# Patient Record
Sex: Female | Born: 1995 | Hispanic: Yes | Marital: Single | Smoking: Never smoker
Health system: Southern US, Community
[De-identification: ages and names within clinical notes are randomized; demographics above are authoritative.]

## PROBLEM LIST (undated history)

## (undated) DIAGNOSIS — N390 Urinary tract infection, site not specified: Secondary | ICD-10-CM

---
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
## 2013-08-07 ENCOUNTER — Emergency Department: Payer: Self-pay | Admitting: Emergency Medicine

## 2013-08-08 LAB — BETA STREP CULTURE(ARMC)

## 2013-12-27 ENCOUNTER — Observation Stay: Payer: Self-pay

## 2014-03-31 ENCOUNTER — Inpatient Hospital Stay: Payer: Self-pay | Admitting: Obstetrics and Gynecology

## 2014-03-31 LAB — CBC WITH DIFFERENTIAL/PLATELET
BASOS ABS: 0.1 10*3/uL (ref 0.0–0.1)
BASOS PCT: 0.4 %
Eosinophil #: 0 10*3/uL (ref 0.0–0.7)
Eosinophil %: 0.2 %
HCT: 38.5 % (ref 35.0–47.0)
HGB: 12.3 g/dL (ref 12.0–16.0)
LYMPHS PCT: 14.8 %
Lymphocyte #: 1.9 10*3/uL (ref 1.0–3.6)
MCH: 29.1 pg (ref 26.0–34.0)
MCHC: 32.1 g/dL (ref 32.0–36.0)
MCV: 91 fL (ref 80–100)
Monocyte #: 1.6 x10 3/mm — ABNORMAL HIGH (ref 0.2–0.9)
Monocyte %: 12.5 %
Neutrophil #: 9.5 10*3/uL — ABNORMAL HIGH (ref 1.4–6.5)
Neutrophil %: 72.1 %
Platelet: 177 10*3/uL (ref 150–440)
RBC: 4.24 10*6/uL (ref 3.80–5.20)
RDW: 17.4 % — ABNORMAL HIGH (ref 11.5–14.5)
WBC: 13.2 10*3/uL — ABNORMAL HIGH (ref 3.6–11.0)

## 2014-03-31 LAB — PROTEIN / CREATININE RATIO, URINE
Creatinine, Urine: 86.6 mg/dL (ref 30.0–125.0)
Protein, Random Urine: 23 mg/dL — ABNORMAL HIGH (ref 0–12)
Protein/Creat. Ratio: 266 mg/gCREAT — ABNORMAL HIGH (ref 0–200)

## 2014-04-02 LAB — CBC WITH DIFFERENTIAL/PLATELET
BASOS PCT: 0.1 %
Basophil #: 0 10*3/uL (ref 0.0–0.1)
Eosinophil #: 0 10*3/uL (ref 0.0–0.7)
Eosinophil %: 0.1 %
HCT: 34 % — ABNORMAL LOW (ref 35.0–47.0)
HGB: 10.7 g/dL — AB (ref 12.0–16.0)
LYMPHS ABS: 0.6 10*3/uL — AB (ref 1.0–3.6)
Lymphocyte %: 3.4 %
MCH: 28.8 pg (ref 26.0–34.0)
MCHC: 31.5 g/dL — ABNORMAL LOW (ref 32.0–36.0)
MCV: 92 fL (ref 80–100)
MONOS PCT: 7.3 %
Monocyte #: 1.3 x10 3/mm — ABNORMAL HIGH (ref 0.2–0.9)
NEUTROS ABS: 16.3 10*3/uL — AB (ref 1.4–6.5)
Neutrophil %: 89.1 %
Platelet: 137 10*3/uL — ABNORMAL LOW (ref 150–440)
RBC: 3.72 10*6/uL — AB (ref 3.80–5.20)
RDW: 17.3 % — AB (ref 11.5–14.5)
WBC: 18.3 10*3/uL — ABNORMAL HIGH (ref 3.6–11.0)

## 2014-04-02 LAB — HEMATOCRIT: HCT: 36 % (ref 35.0–47.0)

## 2014-04-03 LAB — CBC WITH DIFFERENTIAL/PLATELET
Basophil #: 0 10*3/uL (ref 0.0–0.1)
Basophil %: 0.2 %
EOS ABS: 0.1 10*3/uL (ref 0.0–0.7)
EOS PCT: 0.5 %
HCT: 31.4 % — AB (ref 35.0–47.0)
HGB: 9.8 g/dL — AB (ref 12.0–16.0)
Lymphocyte #: 1.1 10*3/uL (ref 1.0–3.6)
Lymphocyte %: 6.5 %
MCH: 28.9 pg (ref 26.0–34.0)
MCHC: 31.3 g/dL — AB (ref 32.0–36.0)
MCV: 92 fL (ref 80–100)
MONOS PCT: 8.7 %
Monocyte #: 1.5 x10 3/mm — ABNORMAL HIGH (ref 0.2–0.9)
NEUTROS PCT: 84.1 %
Neutrophil #: 14.2 10*3/uL — ABNORMAL HIGH (ref 1.4–6.5)
PLATELETS: 128 10*3/uL — AB (ref 150–440)
RBC: 3.4 10*6/uL — ABNORMAL LOW (ref 3.80–5.20)
RDW: 17.7 % — ABNORMAL HIGH (ref 11.5–14.5)
WBC: 16.8 10*3/uL — ABNORMAL HIGH (ref 3.6–11.0)

## 2014-04-04 LAB — CBC WITH DIFFERENTIAL/PLATELET
Basophil #: 0 10*3/uL (ref 0.0–0.1)
Basophil %: 0.2 %
EOS ABS: 0 10*3/uL (ref 0.0–0.7)
EOS PCT: 0.3 %
HCT: 30.4 % — ABNORMAL LOW (ref 35.0–47.0)
HGB: 10.2 g/dL — AB (ref 12.0–16.0)
LYMPHS PCT: 10 %
Lymphocyte #: 1.2 10*3/uL (ref 1.0–3.6)
MCH: 30 pg (ref 26.0–34.0)
MCHC: 33.5 g/dL (ref 32.0–36.0)
MCV: 89 fL (ref 80–100)
Monocyte #: 1.1 x10 3/mm — ABNORMAL HIGH (ref 0.2–0.9)
Monocyte %: 9 %
NEUTROS ABS: 9.5 10*3/uL — AB (ref 1.4–6.5)
NEUTROS PCT: 80.5 %
Platelet: 165 10*3/uL (ref 150–440)
RBC: 3.4 10*6/uL — ABNORMAL LOW (ref 3.80–5.20)
RDW: 17.7 % — AB (ref 11.5–14.5)
WBC: 11.8 10*3/uL — ABNORMAL HIGH (ref 3.6–11.0)

## 2014-04-04 LAB — URINALYSIS, COMPLETE
BILIRUBIN, UR: NEGATIVE
Bilirubin,UR: NEGATIVE
Blood: NEGATIVE
GLUCOSE, UR: NEGATIVE mg/dL (ref 0–75)
Glucose,UR: NEGATIVE mg/dL (ref 0–75)
KETONE: NEGATIVE
Ketone: NEGATIVE
NITRITE: NEGATIVE
Nitrite: NEGATIVE
PH: 7 (ref 4.5–8.0)
PH: 8 (ref 4.5–8.0)
PROTEIN: NEGATIVE
RBC,UR: 103 /HPF (ref 0–5)
SPECIFIC GRAVITY: 1.01 (ref 1.003–1.030)
Specific Gravity: 1.006 (ref 1.003–1.030)
Squamous Epithelial: 3
Squamous Epithelial: <1
WBC UR: 44 /HPF (ref 0–5)

## 2014-04-04 LAB — CREATININE, SERUM
Creatinine: 0.83 mg/dL (ref 0.60–1.30)
EGFR (African American): >60

## 2014-04-04 LAB — PATHOLOGY REPORT

## 2014-04-04 LAB — GENTAMICIN LEVEL, TROUGH: Gentamicin, Trough: <0.2 ug/mL — ABNORMAL LOW (ref 0.0–2.0)

## 2014-04-05 LAB — CREATININE, SERUM: CREATININE: 0.75 mg/dL (ref 0.60–1.30)

## 2014-04-05 LAB — CBC WITH DIFFERENTIAL/PLATELET
BANDS NEUTROPHIL: 2 %
Eosinophil: 1 %
HCT: 31.2 % — AB (ref 35.0–47.0)
HGB: 10.2 g/dL — ABNORMAL LOW (ref 12.0–16.0)
Lymphocytes: 13 %
MCH: 29.3 pg (ref 26.0–34.0)
MCHC: 32.8 g/dL (ref 32.0–36.0)
MCV: 89 fL (ref 80–100)
Monocytes: 6 %
Platelet: 202 10*3/uL (ref 150–440)
RBC: 3.49 10*6/uL — AB (ref 3.80–5.20)
RDW: 17.5 % — AB (ref 11.5–14.5)
Segmented Neutrophils: 78 %
WBC: 8.9 10*3/uL (ref 3.6–11.0)

## 2014-04-05 LAB — URINE CULTURE

## 2014-04-06 LAB — GC/CHLAMYDIA PROBE AMP

## 2014-04-06 LAB — URINE CULTURE

## 2014-04-06 LAB — VANCOMYCIN, TROUGH: Vancomycin, Trough: 7 ug/mL — ABNORMAL LOW (ref 10–20)

## 2014-04-07 LAB — CBC WITH DIFFERENTIAL/PLATELET
HCT: 33.9 % — ABNORMAL LOW (ref 35.0–47.0)
HGB: 11.2 g/dL — ABNORMAL LOW (ref 12.0–16.0)
Lymphocytes: 20 %
MCH: 29.2 pg (ref 26.0–34.0)
MCHC: 33 g/dL (ref 32.0–36.0)
MCV: 89 fL (ref 80–100)
METAMYELOCYTE: 2 %
MYELOCYTE: 1 %
Monocytes: 16 %
Platelet: 265 10*3/uL (ref 150–440)
RBC: 3.82 10*6/uL (ref 3.80–5.20)
RDW: 17.7 % — ABNORMAL HIGH (ref 11.5–14.5)
Segmented Neutrophils: 61 %
WBC: 10.8 10*3/uL (ref 3.6–11.0)

## 2014-04-07 LAB — CULTURE, BLOOD (SINGLE)

## 2014-04-09 LAB — CULTURE, BLOOD (SINGLE)

## 2014-08-05 ENCOUNTER — Emergency Department: Payer: Self-pay | Admitting: Student

## 2014-09-13 ENCOUNTER — Emergency Department: Payer: Self-pay | Admitting: Emergency Medicine

## 2014-09-13 LAB — COMPREHENSIVE METABOLIC PANEL
ALBUMIN: 4.9 g/dL
ALK PHOS: 95 U/L
AST: 24 U/L
Anion Gap: 8 (ref 7–16)
BUN: 14 mg/dL
Bilirubin,Total: 0.9 mg/dL
Calcium, Total: 9.6 mg/dL
Chloride: 106 mmol/L
Co2: 24 mmol/L
Creatinine: 0.58 mg/dL
EGFR (African American): >60
EGFR (Non-African Amer.): >60
GLUCOSE: 122 mg/dL — AB
Potassium: 3.6 mmol/L
SGPT (ALT): 18 U/L
Sodium: 138 mmol/L
TOTAL PROTEIN: 8.2 g/dL — AB

## 2014-09-13 LAB — CBC WITH DIFFERENTIAL/PLATELET
Basophil #: 0 10*3/uL (ref 0.0–0.1)
Basophil %: 0.3 %
EOS ABS: 0.1 10*3/uL (ref 0.0–0.7)
EOS PCT: 0.6 %
HCT: 39 % (ref 35.0–47.0)
HGB: 12.9 g/dL (ref 12.0–16.0)
LYMPHS ABS: 1.2 10*3/uL (ref 1.0–3.6)
Lymphocyte %: 10.4 %
MCH: 28 pg (ref 26.0–34.0)
MCHC: 33.1 g/dL (ref 32.0–36.0)
MCV: 85 fL (ref 80–100)
MONO ABS: 0.6 x10 3/mm (ref 0.2–0.9)
Monocyte %: 5.5 %
Neutrophil #: 9.5 10*3/uL — ABNORMAL HIGH (ref 1.4–6.5)
Neutrophil %: 83.2 %
PLATELETS: 205 10*3/uL (ref 150–440)
RBC: 4.62 10*6/uL (ref 3.80–5.20)
RDW: 13.3 % (ref 11.5–14.5)
WBC: 11.5 10*3/uL — ABNORMAL HIGH (ref 3.6–11.0)

## 2014-09-13 LAB — URINALYSIS, COMPLETE
Bilirubin,UR: NEGATIVE
Blood: NEGATIVE
GLUCOSE, UR: NEGATIVE mg/dL (ref 0–75)
Leukocyte Esterase: NEGATIVE
Nitrite: NEGATIVE
PH: 5 (ref 4.5–8.0)
RBC,UR: <1 /HPF (ref 0–5)
Specific Gravity: 1.03 (ref 1.003–1.030)
Squamous Epithelial: 10

## 2014-09-13 LAB — LIPASE, BLOOD: LIPASE: 19 U/L — AB

## 2014-10-11 NOTE — Op Note (Signed)
PATIENT NAME:  Doris Perez, Doris Perez MR#:  [id number] DATE OF BIRTH:  1995-09-28  DATE OF PROCEDURE:  04/01/2014  PREOPERATIVE DIAGNOSIS: Active phase arrest.   POSTOPERATIVE DIAGNOSIS: Active phase arrest.   PROCEDURES: Primary low transverse cesarean section.   ANESTHESIA: Spinal.   SURGEON: Suzy Bouchard, MD  FIRST ASSISTANT: Cydney Ok, CNM  INDICATION: This is a 19 year old, gravida 1, para 0. The patient has an EDC of 04/10/2014. The patient was admitted in active labor. On 03/31/2014, the patient progressed to anterior lip and did not progress past that despite an adequate contraction pattern. The patient therefore was diagnosed with active phase arrest. The patient was counseled regarding the risk and benefits of the procedure, and consent was signed.   PROCEDURE IN DETAIL: After adequate spinal anesthesia, the patient was placed in the dorsal supine position with a hip roll under the right side. The patient's abdomen was prepped and draped in normal sterile fashion. A Pfannenstiel incision was made 2 fingerbreadths above the symphysis pubis. Sharp dissection was used to dissect to the fascia. The fascia was opened in the midline and opened in a transverse fashion. The superior aspect of the fascia was grasped with Kocher clamps and the recti muscles were dissected free. The inferior aspect of the fascia was grasped with Kocher clamps and the pyramidalis muscle was dissected free. Entry into the peritoneal cavity was performed with sharp dissection. Edematous peritoneum was noted. A lot of serous fluid was noted in the peritoneal cavity as well. The vesicouterine peritoneal fold was identified, and a bladder flap was created. The bladder was reflected inferiorly.   A low transverse uterine incision was made. Upon entry into the endometrial cavity, a creamy white discharge, non- malodorous, was noted. The incision was extended with blunt transverse traction and an extremely wedged  fetal head was then delivered through the uterine incision, and a large infant girl was delivered without difficulty through the incision. The cord was doubly clamped and passed to the nursery staff, who assigned Apgar scores of 8 and 9 at one and five minutes. Estimated fetal weight 3940 g. Female infant.   The placenta was manually delivered, and given the patient had a temperature to 103.1 prior to delivery, the placenta will be sent to pathology for pathologic review. The uterus was exteriorized and the endometrial cavity was wiped clean with a laparotomy tape. The uterine incision was then closed with #1 chromic suture in a running locking fashion with good approximation of the edges. Three additional figure-of-eight sutures were required for good hemostasis. Fallopian tubes appeared normal. Ovaries appeared normal. The posterior cul-de-sac was then irrigated and suctioned, and the uterus was placed back into the abdominal cavity and the paracolic gutters were wiped clean with a laparotomy tape. Uterine incision again appeared hemostatic and Interceed was placed over the uterine incision in a T-shaped fashion.   The fascia was then closed with 0 Vicryl suture in a running nonlocking fashion with good approximation of the edges, and good hemostasis was noted. Subcutaneous tissues were irrigated and bovied, and the skin was reapproximated with staples. There were no complications. Estimated blood loss was 500 mL. Intraoperative fluids 800 mL. The patient did receive 2 g of IV Ancef prior to commencement of the case, and the patient demonstrated a small amount of straw-colored urine during the procedure.   The patient was taken to the recovery room in good condition.    ____________________________ Suzy Bouchard, MD tjs:MT D: 04/01/2014 14:55:11 ET  T: 04/01/2014 16:14:17 ET JOB#: [id number]  cc: Suzy Bouchardhomas J. Schermerhorn, MD, <Dictator> Suzy BouchardHOMAS J SCHERMERHORN MD ELECTRONICALLY SIGNED 04/02/2014  9:09

## 2014-10-28 NOTE — H&P (Signed)
L&D Evaluation:  History:  HPI 19 y/o G1 @ 38/4wks EDC 04/10/14 arrives with c/o bloody show and regular contractions. Baby active, denies leaking fluid. Denies s sx pre/e no HA, visual disturbances, RUQ or epigastric pain. Vomited x 1 after arrival (milkshake on the way to hospital) Care @ [facility], GBS negative.   Presents with contractions   Patient's Medical History No Chronic Illness   Patient's Surgical History none   Medications Pre Natal Vitamins   Allergies PCN, rash   Social History none   Family History Non-Contributory   ROS:  ROS All systems were reviewed.  HEENT, CNS, GI, GU, Respiratory, CV, Renal and Musculoskeletal systems were found to be normal.   Exam:  Vital Signs stable   Urine Protein not completed, to lab   General no apparent distress   Mental Status clear   Chest clear   Heart normal sinus rhythm   Abdomen gravid, non-tender   Estimated Fetal Weight Average for gestational age   Fetal Position vtx   Fundal Height term   Back no CVAT   Edema 4+  Pitting  pedal pre tibial   Reflexes 2+   Clonus negative   Pelvic no external lesions, 4cm 90% vtx @ -2 BBOW nl show   Mebranes Intact   FHT normal rate with no decels, baseline 140's 150's avg variability with accels   Fetal Heart Rate 144   Ucx regular, Q 3/404mins 60 sec moderate   Skin dry   Lymph no lymphadenopathy   Impression:  Impression early labor, evaluation for PIH   Plan:  Plan EFM/NST, monitor contractions and for cervical change, monitor BP, PIH panel   Comments Admitted, breathing thru uc's well-requests epidural. VSS B/P nl AF PIH and pro/creat labs. Pts Mom at bedside, supportive.   Electronic Signatures: Albertina ParrLugiano, Isabel Freese B (CNM)  (Signed 12-Oct-15 18:13)  Authored: L&D Evaluation   Last Updated: 12-Oct-15 18:13 by Albertina ParrLugiano, Dorna Mallet B (CNM)

## 2015-12-29 ENCOUNTER — Encounter: Payer: Self-pay | Admitting: Emergency Medicine

## 2015-12-29 ENCOUNTER — Emergency Department: Payer: BLUE CROSS/BLUE SHIELD

## 2015-12-29 ENCOUNTER — Emergency Department
Admission: EM | Admit: 2015-12-29 | Discharge: 2015-12-29 | Disposition: A | Discharge status: Home / Self Care | Payer: BLUE CROSS/BLUE SHIELD | Attending: Emergency Medicine | Admitting: Emergency Medicine

## 2015-12-29 DIAGNOSIS — Y9389 Activity, other specified: Secondary | ICD-10-CM | POA: Insufficient documentation

## 2015-12-29 DIAGNOSIS — W228XXA Striking against or struck by other objects, initial encounter: Secondary | ICD-10-CM | POA: Insufficient documentation

## 2015-12-29 DIAGNOSIS — S39012A Strain of muscle, fascia and tendon of lower back, initial encounter: Secondary | ICD-10-CM | POA: Insufficient documentation

## 2015-12-29 DIAGNOSIS — Y999 Unspecified external cause status: Secondary | ICD-10-CM | POA: Diagnosis not present

## 2015-12-29 DIAGNOSIS — M545 Low back pain: Secondary | ICD-10-CM | POA: Diagnosis present

## 2015-12-29 DIAGNOSIS — Y929 Unspecified place or not applicable: Secondary | ICD-10-CM | POA: Diagnosis not present

## 2015-12-29 DIAGNOSIS — R51 Headache: Secondary | ICD-10-CM | POA: Insufficient documentation

## 2015-12-29 DIAGNOSIS — R519 Headache, unspecified: Secondary | ICD-10-CM

## 2015-12-29 MED ORDER — METHOCARBAMOL 750 MG PO TABS: 750.0000 mg | ORAL_TABLET | Freq: Four times a day (QID) | ORAL | Status: DC

## 2015-12-29 MED ORDER — BUTALBITAL-APAP-CAFFEINE 50-325-40 MG PO TABS: 1.0000 | ORAL_TABLET | Freq: Four times a day (QID) | ORAL | Status: DC | PRN

## 2015-12-29 NOTE — Discharge Instructions (Signed)
Take medication as directed.
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
## 2015-12-29 NOTE — ED Notes (Signed)
Pt to ed with c/o fall on Friday and hit her head.  Pt reports now headache to back of head.
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
## 2016-02-22 ENCOUNTER — Emergency Department: Payer: BLUE CROSS/BLUE SHIELD

## 2016-02-22 ENCOUNTER — Emergency Department
Admission: EM | Admit: 2016-02-22 | Discharge: 2016-02-22 | Disposition: A | Discharge status: Home / Self Care | Payer: BLUE CROSS/BLUE SHIELD | Attending: Emergency Medicine | Admitting: Emergency Medicine

## 2016-02-22 ENCOUNTER — Encounter: Payer: Self-pay | Admitting: Emergency Medicine

## 2016-02-22 DIAGNOSIS — Y999 Unspecified external cause status: Secondary | ICD-10-CM | POA: Insufficient documentation

## 2016-02-22 DIAGNOSIS — S161XXA Strain of muscle, fascia and tendon at neck level, initial encounter: Secondary | ICD-10-CM | POA: Insufficient documentation

## 2016-02-22 DIAGNOSIS — Y92410 Unspecified street and highway as the place of occurrence of the external cause: Secondary | ICD-10-CM | POA: Insufficient documentation

## 2016-02-22 DIAGNOSIS — Y9389 Activity, other specified: Secondary | ICD-10-CM | POA: Diagnosis not present

## 2016-02-22 DIAGNOSIS — M542 Cervicalgia: Secondary | ICD-10-CM | POA: Diagnosis present

## 2016-02-22 MED ORDER — NAPROXEN 500 MG PO TABS: 500.0000 mg | ORAL_TABLET | Freq: Two times a day (BID) | ORAL | 0 refills | Status: AC

## 2016-02-22 MED ORDER — METHOCARBAMOL 750 MG PO TABS: 750.0000 mg | ORAL_TABLET | Freq: Four times a day (QID) | ORAL | 0 refills | Status: AC
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
## 2017-01-09 ENCOUNTER — Emergency Department
Admission: EM | Admit: 2017-01-09 | Discharge: 2017-01-09 | Disposition: A | Discharge status: Home / Self Care | Payer: Managed Care, Other (non HMO) | Attending: Emergency Medicine | Admitting: Emergency Medicine

## 2017-01-09 DIAGNOSIS — Z791 Long term (current) use of non-steroidal anti-inflammatories (NSAID): Secondary | ICD-10-CM | POA: Insufficient documentation

## 2017-01-09 DIAGNOSIS — Z79899 Other long term (current) drug therapy: Secondary | ICD-10-CM | POA: Insufficient documentation

## 2017-01-09 DIAGNOSIS — L723 Sebaceous cyst: Secondary | ICD-10-CM | POA: Insufficient documentation

## 2017-01-09 DIAGNOSIS — L02212 Cutaneous abscess of back [any part, except buttock]: Secondary | ICD-10-CM | POA: Insufficient documentation

## 2017-01-09 DIAGNOSIS — L089 Local infection of the skin and subcutaneous tissue, unspecified: Secondary | ICD-10-CM | POA: Insufficient documentation

## 2017-01-09 MED ORDER — SULFAMETHOXAZOLE-TRIMETHOPRIM 800-160 MG PO TABS: 1.0000 | ORAL_TABLET | Freq: Two times a day (BID) | ORAL | 0 refills | Status: AC

## 2017-01-09 MED ORDER — LIDOCAINE HCL (PF) 1 % IJ SOLN
5.0000 mL | Freq: Once | INTRAMUSCULAR | Status: AC
  Administered 2017-01-09: 5 mL
  Filled 2017-01-09: qty 5

## 2017-01-09 MED ORDER — SULFAMETHOXAZOLE-TRIMETHOPRIM 800-160 MG PO TABS
1.0000 | ORAL_TABLET | Freq: Once | ORAL | Status: AC
  Administered 2017-01-09: 1 via ORAL
  Filled 2017-01-09: qty 1

## 2017-01-09 NOTE — ED Provider Notes (Signed)
[facility] Emergency Department Provider Note ____________________________________________  Time seen: 1808  I have reviewed the triage vital signs and the nursing notes.  HISTORY  Chief Complaint  Abscess  HPI Doris Perez is a 21 y.o. female presents to the ED for evaluation of an infected cyst on the side of her back. Patient describes she's been known to have a very small, round, stable cyst to the right flank. She notes that over the last 2 weeks area has become increasingly tender, swollen, and dark in color. She is apply warm compresses over the last several days but denies any spontaneous drainage. She denies any history of abscess, boil, or MRSA infection. She presents today for management of an infected sebaceous cyst.  Past Medical History:  Diagnosis Date  . UTI (urinary tract infection)     Patient Active Problem List   Diagnosis Date Noted  . UTI (urinary tract infection)     History reviewed. No pertinent surgical history.  Prior to Admission medications   Medication Sig Start Date End Date Taking? Authorizing Provider  methocarbamol (ROBAXIN) 750 MG tablet Take 1 tablet (750 mg total) by mouth 4 (four) times daily. 02/22/16   Beers, Charmayne Sheerharles M, PA-C  naproxen (NAPROSYN) 500 MG tablet Take 1 tablet (500 mg total) by mouth 2 (two) times daily with a meal. 02/22/16   Beers, Charmayne Sheerharles M, PA-C  sulfamethoxazole-trimethoprim (BACTRIM DS,SEPTRA DS) 800-160 MG tablet Take 1 tablet by mouth 2 (two) times daily. 01/09/17   Keylani Perlstein, Charlesetta IvoryJenise V Bacon, PA-C    Allergies Penicillins  No family history on file.  Social History Social History  Substance Use Topics  . Smoking status: Never Smoker  . Smokeless tobacco: Never Used  . Alcohol use Yes    Review of Systems  Constitutional: Negative for fever. Cardiovascular: Negative for chest pain. Respiratory: Negative for shortness of breath. Musculoskeletal: Negative for back pain. Skin: Negative for  rash. Right trunk infected cyst as above  Neurological: Negative for headaches, focal weakness or numbness. ____________________________________________  PHYSICAL EXAM:  VITAL SIGNS: ED Triage Vitals [01/09/17 1734]  Enc Vitals Group     BP (!) 145/66     Pulse Rate 82     Resp 16     Temp 99 F (37.2 C)     Temp Source Oral     SpO2 100 %     Weight 130 lb (59 kg)     Height      Head Circumference      Peak Flow      Pain Score 6     Pain Loc      Pain Edu?      Excl. in GC?     Constitutional: Alert and oriented. Well appearing and in no distress. Head: Normocephalic and atraumatic. Cardiovascular: Normal rate, regular rhythm. Normal distal pulses. Respiratory: Normal respiratory effort.  Gastrointestinal: Soft and nontender. No distention. Musculoskeletal: Nontender with normal range of motion in all extremities.  Neurologic:  Normal gait without ataxia. Normal speech and language. No gross focal neurologic deficits are appreciated. Skin:  Skin is warm, dry and intact. No rash noted. Patient with a firm, well demarcated area of hyperpigmentation over the right posterior trunk. There is palpable subcutaneous cystic formation noted. There is a small pustule overlying the area but no spontaneous drainage is appreciated. ____________________________________________  PROCEDURES  Bactrim DS 1 PO  INCISION AND DRAINAGE Performed by: Lissa HoardMenshew, Jacy Howat V Bacon Consent: Verbal consent obtained. Risks and benefits:  risks, benefits and alternatives were discussed Type: abscess  Body area: right trunk/back  Anesthesia: local infiltration  Incision was made with a scalpel.  Local anesthetic: lidocaine 1% w/o epinephrine  Anesthetic total: 5 ml  Complexity: complex Blunt dissection to break up loculations  Drainage: purulent  Drainage amount: moderate  Packing material: 1/4 in iodoform gauze  Patient tolerance: Patient tolerated the procedure well with no immediate  complications. ____________________________________________  INITIAL IMPRESSION / ASSESSMENT AND PLAN / ED COURSE  Patient s/p I&D of an infected sebaceous cyst of the trunk. The wound is appropriately dressed and patient is given return instructions. A prescription for Bactrim is provided. ____________________________________________  FINAL CLINICAL IMPRESSION(S) / ED DIAGNOSES  Final diagnoses:  Infected sebaceous cyst of skin  Abscess of back      Lissa Hoard, PA-C 01/09/17 1920    Don Perking, [place], MD 01/09/17 2028

## 2017-01-09 NOTE — ED Notes (Signed)
Pt states she noticed a bump on R hip about a month ago and recently it has grown in size. Unsure of drainage. Denies fever. States it is swollen. Alert, oriented, ambulatory.

## 2017-01-09 NOTE — ED Triage Notes (Signed)
Abscess appearing round bump to right side of back.

## 2017-01-09 NOTE — Discharge Instructions (Signed)
Keep the area clean, dry, and covered. Take the antibiotic as directed. Apply warm compresses over the dressing to promote healing. Follow-up with [facility] or return to the ED in 3 days for wound check.

## 2017-12-13 ENCOUNTER — Other Ambulatory Visit: Payer: Self-pay

## 2017-12-13 ENCOUNTER — Encounter: Payer: Self-pay | Admitting: Emergency Medicine

## 2017-12-13 ENCOUNTER — Emergency Department
Admission: EM | Admit: 2017-12-13 | Discharge: 2017-12-13 | Disposition: A | Discharge status: Home / Self Care | Payer: Commercial Managed Care - PPO | Attending: Emergency Medicine | Admitting: Emergency Medicine

## 2017-12-13 DIAGNOSIS — Z79899 Other long term (current) drug therapy: Secondary | ICD-10-CM | POA: Insufficient documentation

## 2017-12-13 DIAGNOSIS — R0981 Nasal congestion: Secondary | ICD-10-CM

## 2017-12-13 DIAGNOSIS — J020 Streptococcal pharyngitis: Secondary | ICD-10-CM | POA: Diagnosis not present

## 2017-12-13 LAB — GROUP A STREP BY PCR: Group A Strep by PCR: DETECTED — AB

## 2017-12-13 MED ORDER — AZITHROMYCIN 250 MG PO TABS: ORAL_TABLET | ORAL | 0 refills | Status: AC

## 2017-12-13 MED ORDER — FEXOFENADINE-PSEUDOEPHED ER 60-120 MG PO TB12: 1.0000 | ORAL_TABLET | Freq: Two times a day (BID) | ORAL | 0 refills | Status: AC

## 2017-12-13 MED ORDER — MAGIC MOUTHWASH W/LIDOCAINE: 5.0000 mL | Freq: Four times a day (QID) | ORAL | 0 refills | Status: AC

## 2017-12-13 NOTE — ED Provider Notes (Signed)
[facility] Emergency Department Provider Note   ____________________________________________   First MD Initiated Contact with Patient 12/13/17 [phone]     (approximate)  I have reviewed the triage vital signs and the nursing notes.   HISTORY  Chief Complaint Otalgia; Nausea; Sore Throat; and Nasal Congestion    HPI Doris Perez is a 22 y.o. female patient presents with 4 days of sinus congestion, bilateral ear pressure, sore throat, intermittent nausea.  Patient states [facility] ago and given Bromfed-DM of only mild transient relief.  Patient rates the pain as 8/10.  Patient described the pain is "aching".  No other palliative measures for complaint.  Past Medical History:  Diagnosis Date  . UTI (urinary tract infection)     Patient Active Problem List   Diagnosis Date Noted  . UTI (urinary tract infection)     History reviewed. No pertinent surgical history.  Prior to Admission medications   Medication Sig Start Date End Date Taking? Authorizing Provider  azithromycin (ZITHROMAX Z-PAK) 250 MG tablet Take 2 tablets (500 mg) on  Day 1,  followed by 1 tablet (250 mg) once daily on Days 2 through 5. 12/13/17 12/18/17  Joni Reining, PA-C  fexofenadine-pseudoephedrine (ALLEGRA-D) 60-120 MG 12 hr tablet Take 1 tablet by mouth 2 (two) times daily. 12/13/17   Joni Reining, PA-C  magic mouthwash w/lidocaine SOLN Take 5 mLs by mouth 4 (four) times daily. 12/13/17   Joni Reining, PA-C  methocarbamol (ROBAXIN) 750 MG tablet Take 1 tablet (750 mg total) by mouth 4 (four) times daily. 02/22/16   Beers, Charmayne Sheer, PA-C  naproxen (NAPROSYN) 500 MG tablet Take 1 tablet (500 mg total) by mouth 2 (two) times daily with a meal. 02/22/16   Beers, Charmayne Sheer, PA-C  sulfamethoxazole-trimethoprim (BACTRIM DS,SEPTRA DS) 800-160 MG tablet Take 1 tablet by mouth 2 (two) times daily. 01/09/17   Menshew, Charlesetta Ivory, PA-C    Allergies Penicillins  No  family history on file.  Social History Social History   Tobacco Use  . Smoking status: Never Smoker  . Smokeless tobacco: Never Used  Substance Use Topics  . Alcohol use: Yes  . Drug use: No    Review of Systems Constitutional: No fever/chills Eyes: No visual changes. ENT: Nasal congestion, ear pressure and sore throat.   Cardiovascular: Denies chest pain. Respiratory: Denies shortness of breath. Gastrointestinal: No abdominal pain.  Nausea without vomiting. no diarrhea.  No constipation. Genitourinary: Negative for dysuria. Musculoskeletal: Negative for back pain. Skin: Negative for rash. Neurological: Positive for frontal headaches, denies focal weakness or numbness. Allergic/Immunilogical: Penicillin  ____________________________________________   PHYSICAL EXAM:  VITAL SIGNS: ED Triage Vitals  Enc Vitals Group     BP 12/13/17 0902 115/73     Pulse Rate 12/13/17 0902 92     Resp 12/13/17 0902 20     Temp 12/13/17 0902 98.6 F (37 C)     Temp Source 12/13/17 0902 Oral     SpO2 12/13/17 0902 99 %     Weight 12/13/17 0856 138 lb (62.6 kg)     Height 12/13/17 0856 5' (1.524 m)     Head Circumference --      Peak Flow --      Pain Score 12/13/17 0856 8     Pain Loc --      Pain Edu? --      Excl. in GC? --    Constitutional: Alert  and oriented. Well appearing and in no acute distress. Nose: Edematous nasal turbinate thick rhinorrhea.  Bilateral maxillary guarding. Mouth/Throat: Mucous membranes are moist.  Oropharynx erythematous. Neck: No stridor. Hematological/Lymphatic/Immunilogical: No cervical lymphadenopathy. Cardiovascular: Normal rate, regular rhythm. Grossly normal heart sounds.  Good peripheral circulation. Respiratory: Normal respiratory effort.  No retractions. Lungs CTAB. Gastrointestinal: Soft and nontender. No distention. No abdominal bruits. No CVA tenderness. Neurologic:  Normal speech and language. No gross focal neurologic deficits are  appreciated. No gait instability. Skin:  Skin is warm, dry and intact. No rash noted. Psychiatric: Mood and affect are normal. Speech and behavior are normal.  ____________________________________________   LABS (all labs ordered are listed, but only abnormal results are displayed)  Labs Reviewed  GROUP A STREP BY PCR - Abnormal; Notable for the following components:      Result Value   Group A Strep by PCR DETECTED (*)    All other components within normal limits   ____________________________________________  EKG   ____________________________________________  RADIOLOGY  ED MD interpretation:    Official radiology report(s): No results found.  ____________________________________________   PROCEDURES  Procedure(s) performed: None  Procedures  Critical Care performed: No  ____________________________________________   INITIAL IMPRESSION / ASSESSMENT AND PLAN / ED COURSE  As part of my medical decision making, I reviewed the following data within the electronic MEDICAL RECORD NUMBER    Patient complain of bilateral ear pain sore throat and intermittent nausea.  Discussed rapid strep results with patient.  Patient given discharge care instruction.  Patient given prescription for the Z-Pak secondary to her penicillin allergies.  Patient also given prescription for Allegra-D and Magic mouthwash.  Patient advised to follow-up with PCP for continued care.      ____________________________________________   FINAL CLINICAL IMPRESSION(S) / ED DIAGNOSES  Final diagnoses:  Strep pharyngitis  Nasal congestion     ED Discharge Orders        Ordered    azithromycin (ZITHROMAX Z-PAK) 250 MG tablet     12/13/17 1053    fexofenadine-pseudoephedrine (ALLEGRA-D) 60-120 MG 12 hr tablet  2 times daily     12/13/17 1053    magic mouthwash w/lidocaine SOLN  4 times daily     12/13/17 1053       Note:  This document was prepared using Dragon voice recognition software  and may include unintentional dictation errors.    Joni ReiningSmith, Ronald K, PA-C 12/13/17 1056    Emily FilbertWilliams, Jonathan E, MD 12/13/17 [phone]

## 2017-12-13 NOTE — Discharge Instructions (Signed)
Take medication as directed.  Advised over-the-counter Tylenol ibuprofen for headache/pain.

## 2017-12-13 NOTE — ED Notes (Signed)
See triage note presents with bilateral ear pain and sore throat  Was seen and dx'd with URI on Monday  States she feels worse   Afebrile on arrival

## 2017-12-13 NOTE — ED Triage Notes (Signed)
Patient complaining of bilateral ear pain, feeling of pressure behind her eyes, nausea that comes and goes, sore throat.  Seen Monday at [facility] and diagnosed with URI, placed on medicine that starts with a "B".  Here today because she feels worse.  Alert and oriented, color good. NAD.

## 2018-01-06 IMAGING — CT CT HEAD W/O CM
3 series · 16 of 46 positions shown, 19 images · non-contrast
Comparison: None.

CLINICAL DATA: Persistent occipital headache following fall 2 days
prior. Recent loss of consciousness

EXAM:
CT HEAD WITHOUT CONTRAST
TECHNIQUE: Contiguous axial images were obtained from the base of the skull
through the vertex without intravenous contrast.

[Series 2: head wo · axial · 0.41mm/px · z∈[-126,-6]mm · 10 of 29 slices shown, 13 images]
[im 3/29  brain]
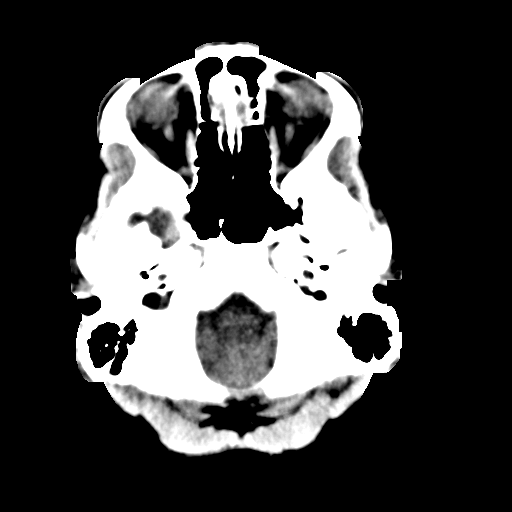
[im 3/29  bone]
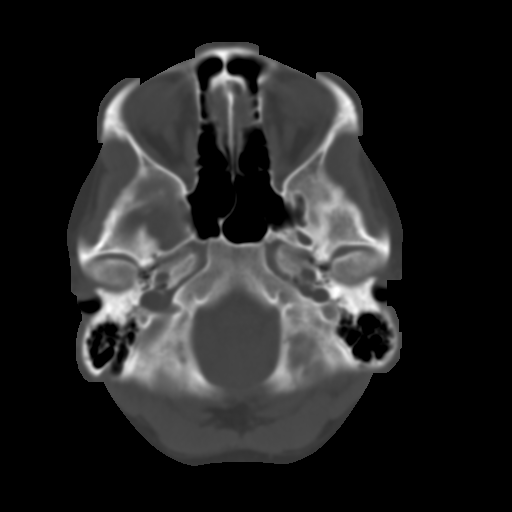
[im 6/29  brain]
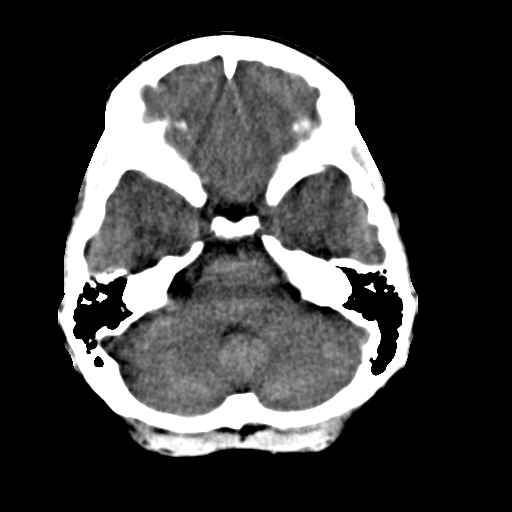
[im 8/29  brain]
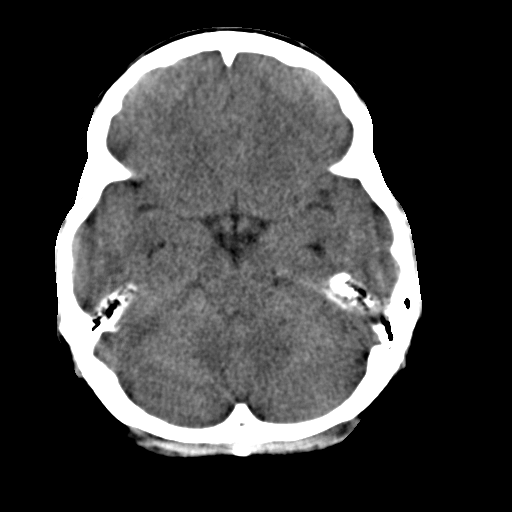
[im 11/29  brain]
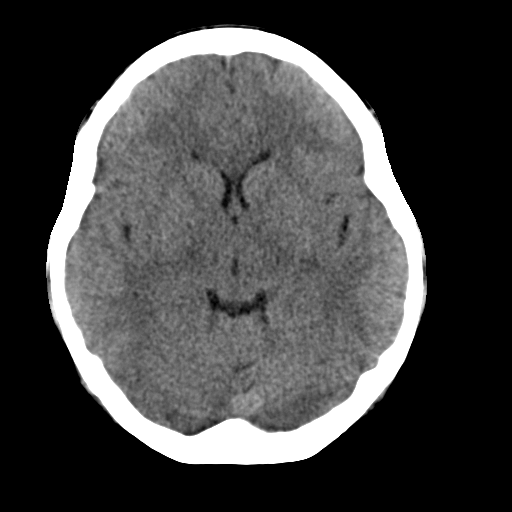
[im 14/29  brain]
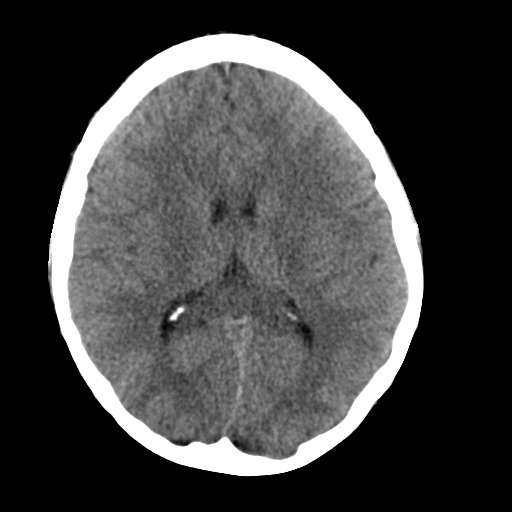
[im 14/29  bone]
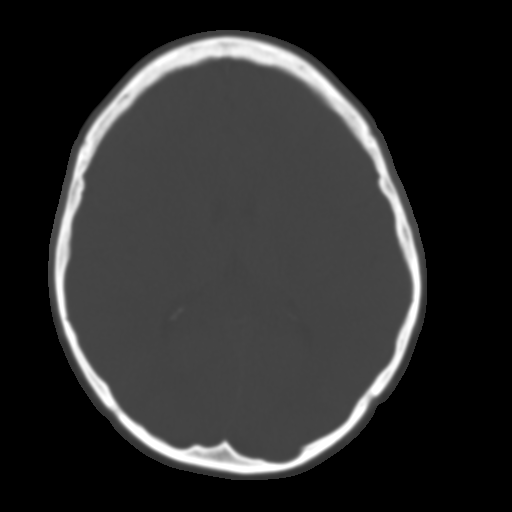
[im 16/29  brain]
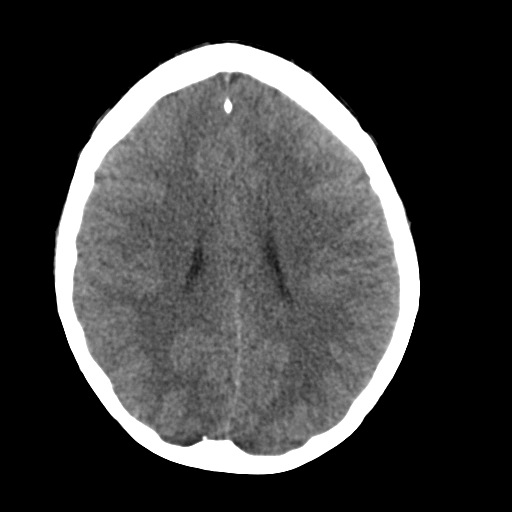
[im 19/29  brain]
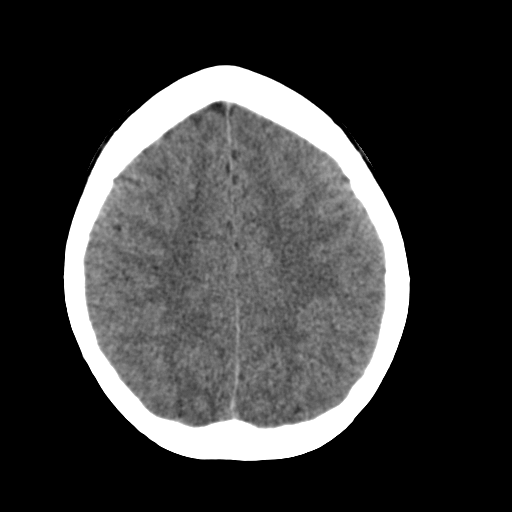
[im 22/29  brain]
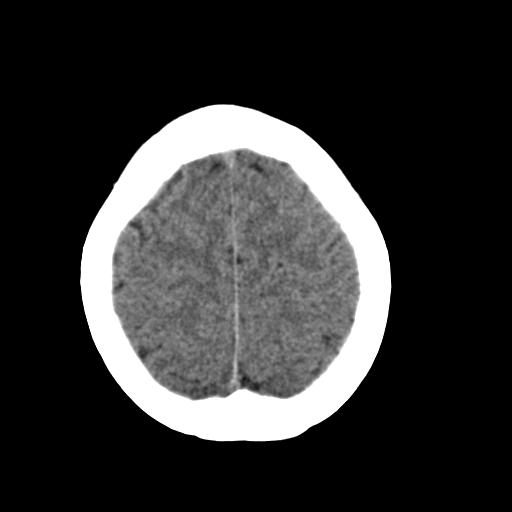
[im 24/29  brain]
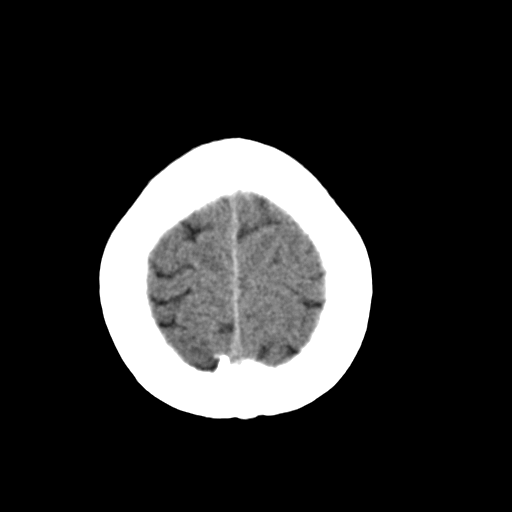
[im 24/29  bone]
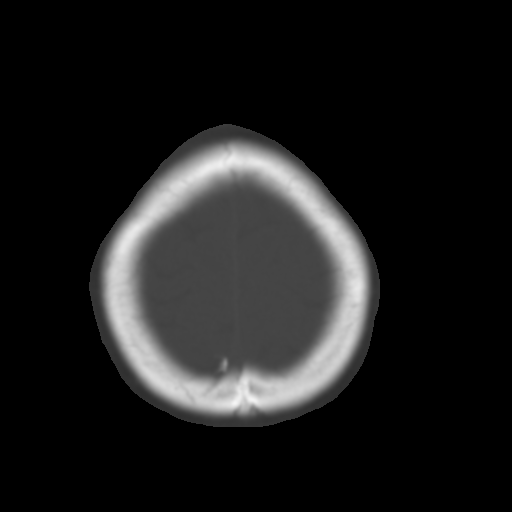
[im 27/29  brain]
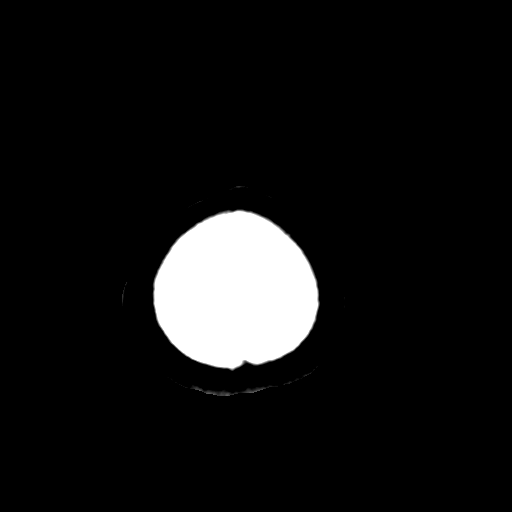

[Series 4: coronal soft tissue · coronal · 0.32mm/px · 3 of 61 slices shown]
[im 21/61  brain]
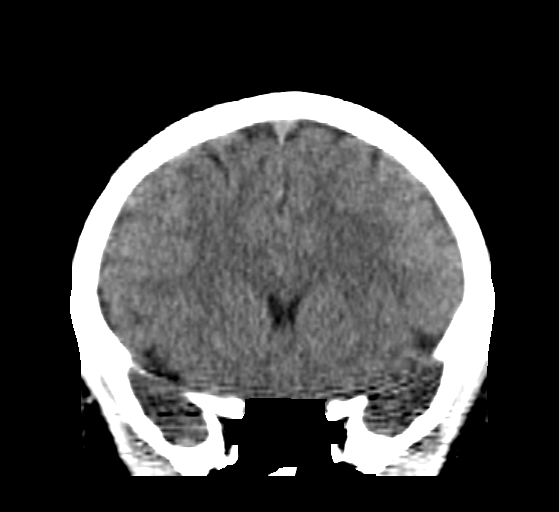
[im 27/61  brain]
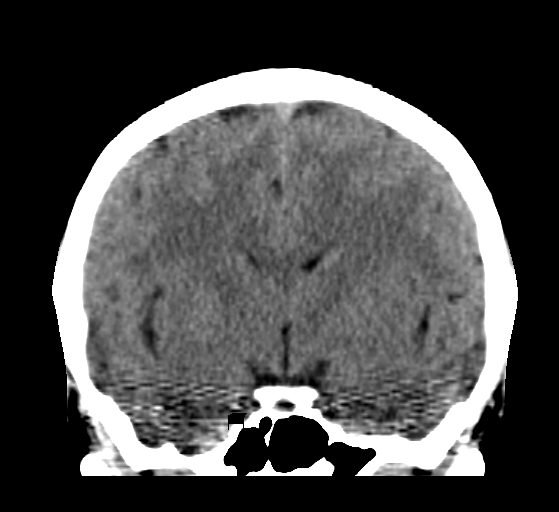
[im 34/61  brain]
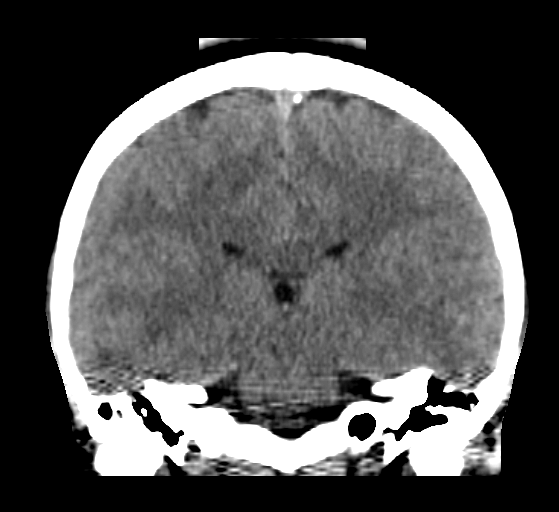

[Series 5: sagittal soft tissue · sagittal · 0.32mm/px · 3 of 54 slices shown]
[im 18/54  brain]
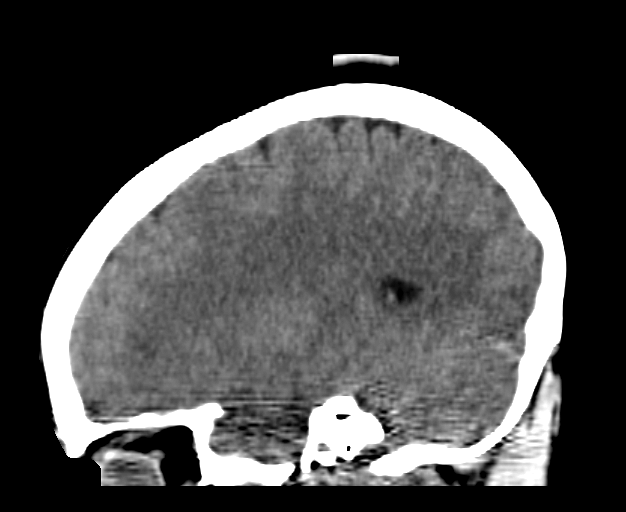
[im 27/54  brain]
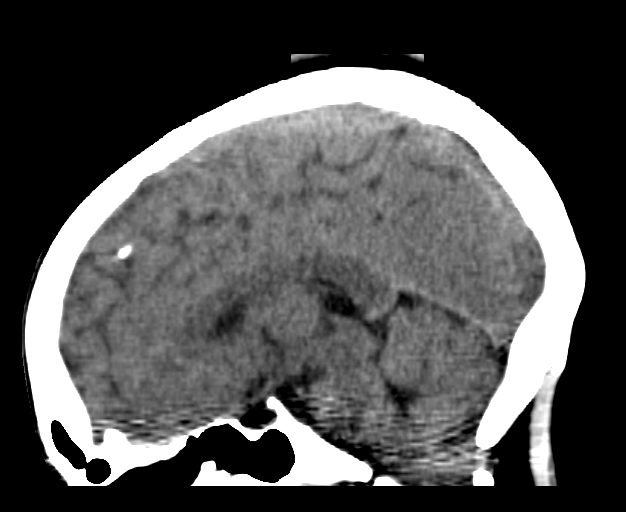
[im 36/54  brain]
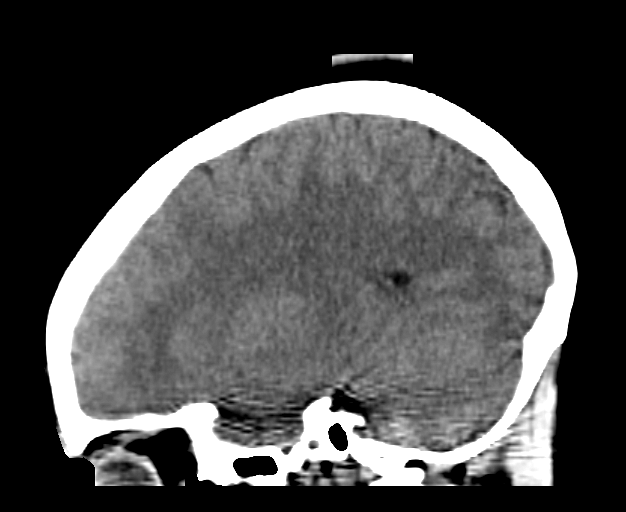

[16 of 46 positions shown; findings below may reference images not displayed]

FINDINGS: Brain: Ventricles are normal in size and configuration. There is no
intracranial mass, hemorrhage, extra-axial fluid collection, or
midline shift. Gray-white compartments. No acute infarct evident.

Vascular: No vascular lesions are evident.

Skull: The bony calvarium appears intact.

Sinuses/Orbits: Visualized paranasal sinuses are clear. Visualized
orbits are symmetric.

Other: Mastoid air cells are clear.
IMPRESSION: Study within normal limits.
